# Patient Record
Sex: Female | Born: 1970 | Race: White | Hispanic: No | Marital: Married | Smoking: Never smoker
Health system: Southern US, Community
[De-identification: ages and names within clinical notes are randomized; demographics above are authoritative.]

## PROBLEM LIST (undated history)

## (undated) DIAGNOSIS — K9041 Non-celiac gluten sensitivity: Secondary | ICD-10-CM

## (undated) DIAGNOSIS — Z8489 Family history of other specified conditions: Secondary | ICD-10-CM

## (undated) DIAGNOSIS — T8859XA Other complications of anesthesia, initial encounter: Secondary | ICD-10-CM

## (undated) DIAGNOSIS — Z9889 Other specified postprocedural states: Secondary | ICD-10-CM

## (undated) DIAGNOSIS — N879 Dysplasia of cervix uteri, unspecified: Secondary | ICD-10-CM

## (undated) DIAGNOSIS — T4145XA Adverse effect of unspecified anesthetic, initial encounter: Secondary | ICD-10-CM

## (undated) DIAGNOSIS — N939 Abnormal uterine and vaginal bleeding, unspecified: Secondary | ICD-10-CM

## (undated) DIAGNOSIS — R112 Nausea with vomiting, unspecified: Secondary | ICD-10-CM

## (undated) DIAGNOSIS — R519 Headache, unspecified: Secondary | ICD-10-CM

## (undated) DIAGNOSIS — K589 Irritable bowel syndrome without diarrhea: Secondary | ICD-10-CM

## (undated) DIAGNOSIS — Z8759 Personal history of other complications of pregnancy, childbirth and the puerperium: Secondary | ICD-10-CM

## (undated) HISTORY — DX: Headache, unspecified: R51.9

## (undated) HISTORY — PX: CERVICAL BIOPSY  W/ LOOP ELECTRODE EXCISION: SUR135

---
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
## 1994-05-27 HISTORY — PX: HYSTEROSCOPY WITH D & C: SHX1775

## 1997-10-24 ENCOUNTER — Other Ambulatory Visit: Admission: RE | Admit: 1997-10-24 | Discharge: 1997-10-24 | Payer: Self-pay | Admitting: Obstetrics and Gynecology

## 2010-06-06 ENCOUNTER — Encounter
Admission: RE | Admit: 2010-06-06 | Discharge: 2010-06-06 | Payer: Self-pay | Source: Home / Self Care | Attending: Obstetrics and Gynecology | Admitting: Obstetrics and Gynecology

## 2010-06-16 ENCOUNTER — Encounter: Payer: Self-pay | Admitting: Obstetrics and Gynecology

## 2011-06-19 ENCOUNTER — Other Ambulatory Visit: Payer: Self-pay | Admitting: Obstetrics and Gynecology

## 2011-06-19 DIAGNOSIS — Z1231 Encounter for screening mammogram for malignant neoplasm of breast: Secondary | ICD-10-CM

## 2011-07-03 ENCOUNTER — Ambulatory Visit: Payer: Self-pay

## 2011-07-09 ENCOUNTER — Ambulatory Visit
Admission: RE | Admit: 2011-07-09 | Discharge: 2011-07-09 | Disposition: A | Discharge status: Home / Self Care | Payer: PRIVATE HEALTH INSURANCE | Source: Ambulatory Visit | Attending: Obstetrics and Gynecology | Admitting: Obstetrics and Gynecology

## 2011-07-09 DIAGNOSIS — Z1231 Encounter for screening mammogram for malignant neoplasm of breast: Secondary | ICD-10-CM

## 2012-06-09 ENCOUNTER — Other Ambulatory Visit: Payer: Self-pay | Admitting: Obstetrics and Gynecology

## 2012-06-09 DIAGNOSIS — Z1231 Encounter for screening mammogram for malignant neoplasm of breast: Secondary | ICD-10-CM

## 2012-06-10 ENCOUNTER — Other Ambulatory Visit: Payer: Self-pay | Admitting: Orthopedic Surgery

## 2012-06-10 DIAGNOSIS — M25511 Pain in right shoulder: Secondary | ICD-10-CM

## 2012-06-11 ENCOUNTER — Ambulatory Visit
Admission: RE | Admit: 2012-06-11 | Discharge: 2012-06-11 | Disposition: A | Discharge status: Home / Self Care | Payer: PRIVATE HEALTH INSURANCE | Source: Ambulatory Visit | Attending: Orthopedic Surgery | Admitting: Orthopedic Surgery

## 2012-06-11 DIAGNOSIS — M25511 Pain in right shoulder: Secondary | ICD-10-CM

## 2012-06-12 ENCOUNTER — Other Ambulatory Visit: Payer: PRIVATE HEALTH INSURANCE

## 2012-07-09 ENCOUNTER — Ambulatory Visit: Payer: PRIVATE HEALTH INSURANCE

## 2012-08-13 ENCOUNTER — Ambulatory Visit
Admission: RE | Admit: 2012-08-13 | Discharge: 2012-08-13 | Disposition: A | Discharge status: Home / Self Care | Payer: PRIVATE HEALTH INSURANCE | Source: Ambulatory Visit | Attending: Obstetrics and Gynecology | Admitting: Obstetrics and Gynecology

## 2013-07-14 ENCOUNTER — Other Ambulatory Visit: Payer: Self-pay

## 2013-07-14 DIAGNOSIS — Z1231 Encounter for screening mammogram for malignant neoplasm of breast: Secondary | ICD-10-CM

## 2013-08-18 ENCOUNTER — Ambulatory Visit
Admission: RE | Admit: 2013-08-18 | Discharge: 2013-08-18 | Disposition: A | Discharge status: Home / Self Care | Payer: PRIVATE HEALTH INSURANCE | Source: Ambulatory Visit

## 2013-08-18 DIAGNOSIS — Z1231 Encounter for screening mammogram for malignant neoplasm of breast: Secondary | ICD-10-CM

## 2014-09-01 ENCOUNTER — Other Ambulatory Visit: Payer: Self-pay

## 2014-09-01 DIAGNOSIS — Z1231 Encounter for screening mammogram for malignant neoplasm of breast: Secondary | ICD-10-CM

## 2014-09-09 ENCOUNTER — Ambulatory Visit
Admission: RE | Admit: 2014-09-09 | Discharge: 2014-09-09 | Disposition: A | Discharge status: Home / Self Care | Payer: 59 | Source: Ambulatory Visit

## 2014-09-09 DIAGNOSIS — Z1231 Encounter for screening mammogram for malignant neoplasm of breast: Secondary | ICD-10-CM

## 2015-06-07 NOTE — H&P (Addendum)
  Martha Hughes [id number] 05/11/2015 S:  [name] presents today for postop.  She had a LEEP procedure November 23.  She is continuing to have irregular spotting since then.  She is on the NuvaRing, and she has actually been struggling with irregular bleeding over  the last year which has not been controlled with the hormones.  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  We did a cervical LEEP hat which also showed severe dysplasia all the way to the resected margins.  Because there was a question of atypical glandular of the cells, we did do an endometrial biopsy which was negative.  I did discuss this with Dr. Andrey Farmerossi who [facility].  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  Due to her history of abnormal uterine bleeding, canal disease, patient and her husband would like to proceed with hysterectomy.  O:  On physical exam, the cervix appears to be healing nicely.  There is some bleeding from the endocervix.  The uterus is anteverted, mobile and nontender.   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  Per the recommendations of the oncologist and patient continuing to have problems with bleeding would prefer to proceed with hysterectomy.  We would like to approach this from a laparoscopically-assisted vaginal hysterectomy with bilateral salpingectomy.  I discussed the pros and cons, risks and benefits and the recovery.  She would like to proceed with this.  Candice Campavid Lavell Supple, MD  This patient has been seen and examined.   All of her questions were answered.  Labs and vital signs reviewed.  Informed consent has been obtained.  The History and Physical is current. 06/19/15 0715 DL

## 2015-06-12 ENCOUNTER — Encounter (HOSPITAL_BASED_OUTPATIENT_CLINIC_OR_DEPARTMENT_OTHER): Payer: Self-pay | Admitting: *Deleted

## 2015-06-12 NOTE — Progress Notes (Signed)
NPO AFTER MN.  ARRIVE AT 0600.  NEEDS T & S AND URINE PREG.  GETTING CBC DONE TUES. 06-13-2015.  AWARE OWER AT MAIN.

## 2015-06-13 DIAGNOSIS — N879 Dysplasia of cervix uteri, unspecified: Secondary | ICD-10-CM | POA: Diagnosis present

## 2015-06-13 DIAGNOSIS — D060 Carcinoma in situ of endocervix: Secondary | ICD-10-CM | POA: Diagnosis not present

## 2015-06-13 LAB — CBC
HEMATOCRIT: 42.3 % (ref 36.0–46.0)
Hemoglobin: 13.6 g/dL (ref 12.0–15.0)
MCH: 28.1 pg (ref 26.0–34.0)
MCHC: 32.2 g/dL (ref 30.0–36.0)
MCV: 87.4 fL (ref 78.0–100.0)
PLATELETS: 249 10*3/uL (ref 150–400)
RBC: 4.84 MIL/uL (ref 3.87–5.11)
RDW: 12.7 % (ref 11.5–15.5)
WBC: 6.1 10*3/uL (ref 4.0–10.5)

## 2015-06-14 ENCOUNTER — Encounter (HOSPITAL_BASED_OUTPATIENT_CLINIC_OR_DEPARTMENT_OTHER): Payer: Self-pay | Admitting: *Deleted

## 2015-06-18 ENCOUNTER — Encounter (HOSPITAL_BASED_OUTPATIENT_CLINIC_OR_DEPARTMENT_OTHER): Payer: Self-pay | Admitting: Anesthesiology

## 2015-06-18 NOTE — Anesthesia Preprocedure Evaluation (Addendum)
Anesthesia Evaluation  Patient identified by MRN, date of birth, ID band Patient awake    Reviewed: Allergy & Precautions, NPO status , Patient's Chart, lab work & pertinent test results  History of Anesthesia Complications (+) Family history of anesthesia reaction and history of anesthetic complications  Airway Mallampati: II  TM Distance: >3 FB Neck ROM: Full    Dental no notable dental hx.    Pulmonary neg pulmonary ROS,    Pulmonary exam normal breath sounds clear to auscultation       Cardiovascular negative cardio ROS Normal cardiovascular exam Rhythm:Regular Rate:Normal     Neuro/Psych negative neurological ROS  negative psych ROS   GI/Hepatic negative GI ROS, Neg liver ROS,   Endo/Other  negative endocrine ROS  Renal/GU negative Renal ROS  negative genitourinary   Musculoskeletal negative musculoskeletal ROS (+)   Abdominal   Peds negative pediatric ROS (+)  Hematology negative hematology ROS (+)   Anesthesia Other Findings   Reproductive/Obstetrics negative OB ROS                             Anesthesia Physical Anesthesia Plan  ASA: II  Anesthesia Plan: General   Post-op Pain Management:    Induction: Intravenous  Airway Management Planned: Oral ETT  Additional Equipment:   Intra-op Plan:   Post-operative Plan: Extubation in OR  Informed Consent: I have reviewed the patients History and Physical, chart, labs and discussed the procedure including the risks, benefits and alternatives for the proposed anesthesia with the patient or authorized representative who has indicated his/her understanding and acceptance.   Dental advisory given  Plan Discussed with: CRNA  Anesthesia Plan Comments:         Anesthesia Quick Evaluation

## 2015-06-19 ENCOUNTER — Observation Stay (HOSPITAL_BASED_OUTPATIENT_CLINIC_OR_DEPARTMENT_OTHER)
Admission: RE | Admit: 2015-06-19 | Discharge: 2015-06-20 | Disposition: A | Discharge status: Home / Self Care | Payer: 59 | Source: Ambulatory Visit | Attending: Obstetrics and Gynecology | Admitting: Obstetrics and Gynecology

## 2015-06-19 ENCOUNTER — Ambulatory Visit (HOSPITAL_BASED_OUTPATIENT_CLINIC_OR_DEPARTMENT_OTHER): Payer: 59 | Admitting: Anesthesiology

## 2015-06-19 ENCOUNTER — Encounter (HOSPITAL_COMMUNITY)
Admission: RE | Disposition: A | Discharge status: Home / Self Care | Payer: Self-pay | Source: Ambulatory Visit | Attending: Obstetrics and Gynecology

## 2015-06-19 ENCOUNTER — Encounter (HOSPITAL_BASED_OUTPATIENT_CLINIC_OR_DEPARTMENT_OTHER): Payer: Self-pay | Admitting: *Deleted

## 2015-06-19 DIAGNOSIS — D060 Carcinoma in situ of endocervix: Principal | ICD-10-CM | POA: Insufficient documentation

## 2015-06-19 DIAGNOSIS — Z90710 Acquired absence of both cervix and uterus: Secondary | ICD-10-CM | POA: Diagnosis present

## 2015-06-19 HISTORY — DX: Irritable bowel syndrome, unspecified: K58.9

## 2015-06-19 HISTORY — DX: Dysplasia of cervix uteri, unspecified: N87.9

## 2015-06-19 HISTORY — DX: Other specified postprocedural states: Z98.890

## 2015-06-19 HISTORY — DX: Other complications of anesthesia, initial encounter: T88.59XA

## 2015-06-19 HISTORY — DX: Abnormal uterine and vaginal bleeding, unspecified: N93.9

## 2015-06-19 HISTORY — DX: Nausea with vomiting, unspecified: R11.2

## 2015-06-19 HISTORY — DX: Non-celiac gluten sensitivity: K90.41

## 2015-06-19 HISTORY — DX: Adverse effect of unspecified anesthetic, initial encounter: T41.45XA

## 2015-06-19 HISTORY — DX: Family history of other specified conditions: Z84.89

## 2015-06-19 HISTORY — PX: LAPAROSCOPIC VAGINAL HYSTERECTOMY WITH SALPINGECTOMY: SHX6680

## 2015-06-19 HISTORY — DX: Personal history of other complications of pregnancy, childbirth and the puerperium: Z87.59

## 2015-06-19 LAB — TYPE AND SCREEN
ABO/RH(D): O NEG
ANTIBODY SCREEN: NEGATIVE

## 2015-06-19 SURGERY — HYSTERECTOMY, VAGINAL, LAPAROSCOPY-ASSISTED, WITH SALPINGECTOMY
Anesthesia: General | Site: Uterus

## 2015-06-19 MED ORDER — ROCURONIUM BROMIDE 100 MG/10ML IV SOLN
INTRAVENOUS | Status: DC | PRN
  Administered 2015-06-19: 25 mg via INTRAVENOUS
  Administered 2015-06-19: 5 mg via INTRAVENOUS

## 2015-06-19 MED ORDER — OXYCODONE-ACETAMINOPHEN 5-325 MG PO TABS
1.0000 | ORAL_TABLET | ORAL | Status: DC | PRN
  Administered 2015-06-20: 1 via ORAL
  Filled 2015-06-19: qty 1

## 2015-06-19 MED ORDER — ARTIFICIAL TEARS OP OINT
TOPICAL_OINTMENT | OPHTHALMIC | Status: AC
  Filled 2015-06-19: qty 3.5

## 2015-06-19 MED ORDER — EPHEDRINE SULFATE 50 MG/ML IJ SOLN
INTRAMUSCULAR | Status: AC
  Filled 2015-06-19: qty 1

## 2015-06-19 MED ORDER — MIDAZOLAM HCL 2 MG/2ML IJ SOLN
INTRAMUSCULAR | Status: AC
  Filled 2015-06-19: qty 2

## 2015-06-19 MED ORDER — ONDANSETRON HCL 4 MG/2ML IJ SOLN
4.0000 mg | Freq: Four times a day (QID) | INTRAMUSCULAR | Status: DC | PRN
  Administered 2015-06-19: 4 mg via INTRAVENOUS
  Filled 2015-06-19: qty 2

## 2015-06-19 MED ORDER — PROPOFOL 10 MG/ML IV BOLUS
INTRAVENOUS | Status: AC
  Filled 2015-06-19: qty 40

## 2015-06-19 MED ORDER — SCOPOLAMINE 1 MG/3DAYS TD PT72
1.0000 | MEDICATED_PATCH | TRANSDERMAL | Status: DC
  Administered 2015-06-19: 1.5 mg via TRANSDERMAL
  Filled 2015-06-19: qty 1

## 2015-06-19 MED ORDER — LACTATED RINGERS IR SOLN
Status: DC | PRN
  Administered 2015-06-19: 3000 mL

## 2015-06-19 MED ORDER — SCOPOLAMINE 1 MG/3DAYS TD PT72
MEDICATED_PATCH | TRANSDERMAL | Status: AC
  Filled 2015-06-19: qty 1

## 2015-06-19 MED ORDER — HYDROMORPHONE 1 MG/ML IV SOLN
INTRAVENOUS | Status: DC
  Administered 2015-06-19: 2.6 mg via INTRAVENOUS
  Administered 2015-06-19: 13:00:00 via INTRAVENOUS
  Administered 2015-06-20: 1.2 mg via INTRAVENOUS
  Administered 2015-06-20: 1.4 mg via INTRAVENOUS
  Administered 2015-06-20: 0.6 mg via INTRAVENOUS
  Filled 2015-06-19: qty 25

## 2015-06-19 MED ORDER — FENTANYL CITRATE (PF) 100 MCG/2ML IJ SOLN
INTRAMUSCULAR | Status: DC | PRN
  Administered 2015-06-19 (×7): 50 ug via INTRAVENOUS

## 2015-06-19 MED ORDER — IBUPROFEN 200 MG PO TABS
600.0000 mg | ORAL_TABLET | Freq: Four times a day (QID) | ORAL | Status: DC | PRN
  Administered 2015-06-20: 600 mg via ORAL
  Filled 2015-06-19: qty 3

## 2015-06-19 MED ORDER — DEXTROSE-NACL 5-0.45 % IV SOLN
INTRAVENOUS | Status: DC
Start: 2015-06-19 — End: 2015-06-20
  Administered 2015-06-19 – 2015-06-20 (×3): via INTRAVENOUS

## 2015-06-19 MED ORDER — DEXTROSE 5 % IV SOLN
2.0000 g | INTRAVENOUS | Status: AC
  Administered 2015-06-19: 2 g via INTRAVENOUS
  Filled 2015-06-19: qty 2

## 2015-06-19 MED ORDER — MENTHOL 3 MG MT LOZG
1.0000 | LOZENGE | OROMUCOSAL | Status: DC | PRN
  Filled 2015-06-19: qty 9

## 2015-06-19 MED ORDER — GLYCOPYRROLATE 0.2 MG/ML IJ SOLN
INTRAMUSCULAR | Status: DC | PRN
  Administered 2015-06-19: 0.6 mg via INTRAVENOUS

## 2015-06-19 MED ORDER — KETOROLAC TROMETHAMINE 30 MG/ML IJ SOLN
INTRAMUSCULAR | Status: AC
  Filled 2015-06-19: qty 1

## 2015-06-19 MED ORDER — CEFOTETAN DISODIUM-DEXTROSE 2-2.08 GM-% IV SOLR
INTRAVENOUS | Status: AC
  Filled 2015-06-19: qty 50

## 2015-06-19 MED ORDER — DIPHENHYDRAMINE HCL 12.5 MG/5ML PO ELIX: 12.5000 mg | ORAL_SOLUTION | Freq: Four times a day (QID) | ORAL | Status: DC | PRN

## 2015-06-19 MED ORDER — FENTANYL CITRATE (PF) 100 MCG/2ML IJ SOLN
INTRAMUSCULAR | Status: AC
  Filled 2015-06-19: qty 2

## 2015-06-19 MED ORDER — LACTATED RINGERS IV SOLN
INTRAVENOUS | Status: DC
  Administered 2015-06-19 (×3): via INTRAVENOUS
  Filled 2015-06-19: qty 1000

## 2015-06-19 MED ORDER — SUCCINYLCHOLINE CHLORIDE 20 MG/ML IJ SOLN
INTRAMUSCULAR | Status: DC | PRN
  Administered 2015-06-19: 100 mg via INTRAVENOUS

## 2015-06-19 MED ORDER — FENTANYL CITRATE (PF) 250 MCG/5ML IJ SOLN
INTRAMUSCULAR | Status: AC
  Filled 2015-06-19: qty 5

## 2015-06-19 MED ORDER — SODIUM CHLORIDE 0.9 % IR SOLN
Status: DC | PRN
  Administered 2015-06-19: 1000 mL

## 2015-06-19 MED ORDER — DEXAMETHASONE SODIUM PHOSPHATE 10 MG/ML IJ SOLN
INTRAMUSCULAR | Status: AC
  Filled 2015-06-19: qty 1

## 2015-06-19 MED ORDER — HYDROMORPHONE HCL 1 MG/ML IJ SOLN
INTRAMUSCULAR | Status: AC
  Filled 2015-06-19: qty 1

## 2015-06-19 MED ORDER — ACETAMINOPHEN 10 MG/ML IV SOLN
INTRAVENOUS | Status: DC | PRN
  Administered 2015-06-19: 1000 mg via INTRAVENOUS

## 2015-06-19 MED ORDER — ONDANSETRON HCL 4 MG/2ML IJ SOLN
INTRAMUSCULAR | Status: AC
  Filled 2015-06-19: qty 2

## 2015-06-19 MED ORDER — SODIUM CHLORIDE 0.9 % IJ SOLN: 9.0000 mL | INTRAMUSCULAR | Status: DC | PRN

## 2015-06-19 MED ORDER — NEOSTIGMINE METHYLSULFATE 10 MG/10ML IV SOLN
INTRAVENOUS | Status: AC
  Filled 2015-06-19: qty 1

## 2015-06-19 MED ORDER — EPHEDRINE SULFATE 50 MG/ML IJ SOLN
INTRAMUSCULAR | Status: DC | PRN
  Administered 2015-06-19: 10 mg via INTRAVENOUS
  Administered 2015-06-19 (×2): 20 mg via INTRAVENOUS

## 2015-06-19 MED ORDER — GLYCOPYRROLATE 0.2 MG/ML IJ SOLN
INTRAMUSCULAR | Status: AC
  Filled 2015-06-19: qty 3

## 2015-06-19 MED ORDER — LIDOCAINE HCL 4 % MT SOLN
OROMUCOSAL | Status: DC | PRN
  Administered 2015-06-19: 2 mL via TOPICAL

## 2015-06-19 MED ORDER — BUPIVACAINE HCL (PF) 0.25 % IJ SOLN
INTRAMUSCULAR | Status: DC | PRN
  Administered 2015-06-19: 10 mL

## 2015-06-19 MED ORDER — HYDROMORPHONE HCL 1 MG/ML IJ SOLN: 0.2000 mg | INTRAMUSCULAR | Status: DC | PRN

## 2015-06-19 MED ORDER — SODIUM CHLORIDE 0.9 % IR SOLN
Status: DC | PRN
  Administered 2015-06-19: 500 mL

## 2015-06-19 MED ORDER — NALOXONE HCL 0.4 MG/ML IJ SOLN: 0.4000 mg | INTRAMUSCULAR | Status: DC | PRN

## 2015-06-19 MED ORDER — DEXAMETHASONE SODIUM PHOSPHATE 4 MG/ML IJ SOLN
INTRAMUSCULAR | Status: DC | PRN
  Administered 2015-06-19: 10 mg via INTRAVENOUS

## 2015-06-19 MED ORDER — LIDOCAINE HCL (CARDIAC) 20 MG/ML IV SOLN
INTRAVENOUS | Status: DC | PRN
  Administered 2015-06-19: 60 mg via INTRAVENOUS

## 2015-06-19 MED ORDER — ONDANSETRON HCL 4 MG/2ML IJ SOLN
INTRAMUSCULAR | Status: DC | PRN
  Administered 2015-06-19: 4 mg via INTRAVENOUS

## 2015-06-19 MED ORDER — LIDOCAINE HCL (CARDIAC) 20 MG/ML IV SOLN
INTRAVENOUS | Status: AC
  Filled 2015-06-19: qty 10

## 2015-06-19 MED ORDER — MIDAZOLAM HCL 5 MG/5ML IJ SOLN
INTRAMUSCULAR | Status: DC | PRN
  Administered 2015-06-19: 2 mg via INTRAVENOUS

## 2015-06-19 MED ORDER — ACETAMINOPHEN 10 MG/ML IV SOLN
INTRAVENOUS | Status: AC
  Filled 2015-06-19: qty 100

## 2015-06-19 MED ORDER — PROPOFOL 10 MG/ML IV BOLUS
INTRAVENOUS | Status: DC | PRN
  Administered 2015-06-19: 20 mg via INTRAVENOUS
  Administered 2015-06-19: 200 mg via INTRAVENOUS
  Administered 2015-06-19: 30 mg via INTRAVENOUS

## 2015-06-19 MED ORDER — HYDROMORPHONE HCL 1 MG/ML IJ SOLN
0.2500 mg | INTRAMUSCULAR | Status: DC | PRN
  Administered 2015-06-19: 0.25 mg via INTRAVENOUS
  Administered 2015-06-19: 0.5 mg via INTRAVENOUS
  Administered 2015-06-19 (×2): 0.25 mg via INTRAVENOUS
  Filled 2015-06-19: qty 1

## 2015-06-19 MED ORDER — MIDAZOLAM HCL 5 MG/5ML IJ SOLN: INTRAMUSCULAR | Status: DC | PRN

## 2015-06-19 MED ORDER — PROMETHAZINE HCL 25 MG/ML IJ SOLN
6.2500 mg | INTRAMUSCULAR | Status: DC | PRN
  Filled 2015-06-19: qty 1

## 2015-06-19 MED ORDER — NEOSTIGMINE METHYLSULFATE 10 MG/10ML IV SOLN
INTRAVENOUS | Status: DC | PRN
  Administered 2015-06-19: 4 mg via INTRAVENOUS

## 2015-06-19 MED ORDER — DIPHENHYDRAMINE HCL 50 MG/ML IJ SOLN: 12.5000 mg | Freq: Four times a day (QID) | INTRAMUSCULAR | Status: DC | PRN

## 2015-06-19 SURGICAL SUPPLY — 43 items
BLADE SURG 15 STRL LF C SS BP (BLADE) ×1 IMPLANT
BLADE SURG 15 STRL SS (BLADE) ×1
CATH ROBINSON RED A/P 16FR (CATHETERS) ×2 IMPLANT
CHLORAPREP W/TINT 26ML (MISCELLANEOUS) ×2 IMPLANT
COVER BACK TABLE 60X90IN (DRAPES) ×4 IMPLANT
DRSG COVADERM PLUS 2X2 (GAUZE/BANDAGES/DRESSINGS) ×4 IMPLANT
DRSG OPSITE POSTOP 3X4 (GAUZE/BANDAGES/DRESSINGS) ×2 IMPLANT
DURAPREP 26ML APPLICATOR (WOUND CARE) IMPLANT
ELECT LIGASURE LONG (ELECTRODE) ×2 IMPLANT
ELECT REM PT RETURN 9FT ADLT (ELECTROSURGICAL)
ELECTRODE REM PT RTRN 9FT ADLT (ELECTROSURGICAL) IMPLANT
FORCEPS CUTTING 45CM 5MM (CUTTING FORCEPS) ×2 IMPLANT
GLOVE BIO SURGEON STRL SZ8 (GLOVE) ×2 IMPLANT
GLOVE BIOGEL PI IND STRL 6.5 (GLOVE) ×1 IMPLANT
GLOVE BIOGEL PI IND STRL 7.0 (GLOVE) ×3 IMPLANT
GLOVE BIOGEL PI INDICATOR 6.5 (GLOVE) ×1
GLOVE BIOGEL PI INDICATOR 7.0 (GLOVE) ×3
GLOVE SURG ORTHO 8.0 STRL STRW (GLOVE) ×8 IMPLANT
IV LACTATED RINGER IRRG 3000ML (IV SOLUTION) ×1
IV LR IRRIG 3000ML ARTHROMATIC (IV SOLUTION) ×1 IMPLANT
IV NS 1000ML (IV SOLUTION) ×1
IV NS 1000ML BAXH (IV SOLUTION) ×1 IMPLANT
LIGASURE LAP L-HOOKWIRE 5 44CM (INSTRUMENTS) IMPLANT
LIQUID BAND (GAUZE/BANDAGES/DRESSINGS) ×2 IMPLANT
NEEDLE INSUFFLATION 120MM (ENDOMECHANICALS) ×2 IMPLANT
NS IRRIG 500ML POUR BTL (IV SOLUTION) ×2 IMPLANT
PACK LAVH (CUSTOM PROCEDURE TRAY) ×2 IMPLANT
SET IRRIG TUBING LAPAROSCOPIC (IRRIGATION / IRRIGATOR) IMPLANT
SLEEVE XCEL OPT CAN 5 100 (ENDOMECHANICALS) ×2 IMPLANT
STRIP CLOSURE SKIN 1/4X3 (GAUZE/BANDAGES/DRESSINGS) IMPLANT
SUT MNCRL 0 MO-4 VIOLET 18 CR (SUTURE) ×2 IMPLANT
SUT MNCRL 0 VIOLET 6X18 (SUTURE) ×1 IMPLANT
SUT MNCRL AB 0 CT1 27 (SUTURE) IMPLANT
SUT MON AB 2-0 CT1 36 (SUTURE) IMPLANT
SUT MONOCRYL 0 6X18 (SUTURE) ×1
SUT MONOCRYL 0 MO 4 18  CR/8 (SUTURE) ×2
SUT VICRYL 0 UR6 27IN ABS (SUTURE) ×2 IMPLANT
SUT VICRYL RAPIDE 3 0 (SUTURE) ×2 IMPLANT
TOWEL OR 17X24 6PK STRL BLUE (TOWEL DISPOSABLE) ×4 IMPLANT
TRAY FOLEY CATH SILVER 14FR (SET/KITS/TRAYS/PACK) ×2 IMPLANT
TROCAR OPTI TIP 5M 100M (ENDOMECHANICALS) ×2 IMPLANT
TROCAR XCEL DIL TIP R 11M (ENDOMECHANICALS) ×2 IMPLANT
WARMER LAPAROSCOPE (MISCELLANEOUS) ×2 IMPLANT

## 2015-06-19 NOTE — Transfer of Care (Signed)
Last Vitals:  Filed Vitals:   06/19/15 0619  BP: 108/49  Pulse: 83  Temp: 37.1 C  Resp: 12    Immediate Anesthesia Transfer of Care Note  Patient: Martha Hughes  Procedure(s) Performed: Procedure(s) (LRB): LAPAROSCOPIC ASSISTED VAGINAL HYSTERECTOMY WITH SALPINGECTOMY (N/A)  Patient Location: PACU  Anesthesia Type: General  Level of Consciousness: awake, alert  and oriented  Airway & Oxygen Therapy: Patient Spontanous Breathing and Patient connected to face mask oxygen  Post-op Assessment: Report given to PACU RN and Post -op Vital signs reviewed and stable  Post vital signs: Reviewed and stable  Complications: No apparent anesthesia complications

## 2015-06-19 NOTE — Progress Notes (Signed)
Pt arrived to room 1345 via stretcher from PACU. Pt is alert and oriented. VSS. Oriented pt and family to room and unit. Call bell is within reach. Will continue to monitor.

## 2015-06-19 NOTE — Anesthesia Procedure Notes (Addendum)
Procedure Name: Intubation Date/Time: 06/19/2015 7:33 AM Performed by: Mechele Claude Pre-anesthesia Checklist: Patient identified, Emergency Drugs available, Suction available and Patient being monitored Patient Re-evaluated:Patient Re-evaluated prior to inductionOxygen Delivery Method: Circle System Utilized Preoxygenation: Pre-oxygenation with 100% oxygen Intubation Type: IV induction and Cricoid Pressure applied Ventilation: Mask ventilation without difficulty Laryngoscope Size: Mac and 3 Grade View: Grade I Tube type: Oral Tube size: 7.0 mm Number of attempts: 1 Airway Equipment and Method: Stylet and LTA kit utilized Placement Confirmation: ETT inserted through vocal cords under direct vision,  positive ETCO2 and breath sounds checked- equal and bilateral Secured at: 20 cm Tube secured with: Tape Dental Injury: Teeth and Oropharynx as per pre-operative assessment

## 2015-06-19 NOTE — Anesthesia Postprocedure Evaluation (Signed)
Anesthesia Post Note  Patient: Martha Hughes  Procedure(s) Performed: Procedure(s) (LRB): LAPAROSCOPIC ASSISTED VAGINAL HYSTERECTOMY WITH RIGHT SALPINGECTOMY (N/A)  Patient location during evaluation: PACU Anesthesia Type: General Level of consciousness: awake and alert Pain management: pain level controlled Vital Signs Assessment: post-procedure vital signs reviewed and stable Respiratory status: spontaneous breathing, nonlabored ventilation, respiratory function stable and patient connected to nasal cannula oxygen Cardiovascular status: blood pressure returned to baseline and stable Postop Assessment: no signs of nausea or vomiting Anesthetic complications: no    Last Vitals:  Filed Vitals:   06/19/15 1145 06/19/15 1200  BP: 133/78 129/73  Pulse: 92 80  Temp:    Resp: 16 12    Last Pain:  Filed Vitals:   06/19/15 1250  PainSc: 5                  Eastyn Dattilo J

## 2015-06-20 ENCOUNTER — Encounter (HOSPITAL_BASED_OUTPATIENT_CLINIC_OR_DEPARTMENT_OTHER): Payer: Self-pay | Admitting: Obstetrics and Gynecology

## 2015-06-20 DIAGNOSIS — D060 Carcinoma in situ of endocervix: Secondary | ICD-10-CM | POA: Diagnosis not present

## 2015-06-20 LAB — CBC
HCT: 34 % — ABNORMAL LOW (ref 36.0–46.0)
Hemoglobin: 11.1 g/dL — ABNORMAL LOW (ref 12.0–15.0)
MCH: 28.5 pg (ref 26.0–34.0)
MCHC: 32.6 g/dL (ref 30.0–36.0)
MCV: 87.2 fL (ref 78.0–100.0)
Platelets: 217 10*3/uL (ref 150–400)
RBC: 3.9 MIL/uL (ref 3.87–5.11)
RDW: 13 % (ref 11.5–15.5)
WBC: 9.7 10*3/uL (ref 4.0–10.5)

## 2015-06-20 LAB — ABO/RH: ABO/RH(D): O NEG

## 2015-06-20 MED ORDER — OXYCODONE-ACETAMINOPHEN 5-325 MG PO TABS: 1.0000 | ORAL_TABLET | ORAL | Status: DC | PRN

## 2015-06-20 MED ORDER — IBUPROFEN 600 MG PO TABS: 600.0000 mg | ORAL_TABLET | Freq: Four times a day (QID) | ORAL | Status: AC | PRN

## 2015-06-20 NOTE — Progress Notes (Signed)
Patient d/c home,stable. Tolerated her diet at breakfast and lunch, no c/o n/v. Pain is controlled with po pain med. Has good po intake. Voided this shift. D/c instructions given, teach back utilized. Prescriptions given.

## 2015-06-20 NOTE — Discharge Summary (Signed)
Physician Discharge Summary  Patient ID: RANESHA VAL MRN: [id number] DOB/AGE: [birth year] 45 y.o.  Admit date: 06/19/2015 Discharge date: 06/20/2015  Admission Diagnoses:  Discharge Diagnoses:  Active Problems:   S/P laparoscopic assisted vaginal hysterectomy (LAVH)   Discharged Condition: good  Hospital Course: Pt underwent LAVH Bil salpingectomy with EBL 75cc.  Her postop course was unremarkable with return of bowel and bladder function on POD #1.  Tolerating po and pain well controlled with oral meds she was d/c'd home  Consults: None  Significant Diagnostic Studies: labs: post op Hgb 11.1  Treatments: surgery: LAVH bil Salpingectomy  Discharge Exam: Blood pressure 97/52, pulse 73, temperature 98.6 F (37 C), temperature source Oral, resp. rate 17, height  (1.676 m), weight 128 lb (58.06 kg), last menstrual period 05/11/2015, SpO2 100 %. General appearance: alert, cooperative, appears stated age and no distress GI: soft, non-tender; bowel sounds normal; no masses,  no organomegaly Incision/Wound:CD&I  Disposition: Final discharge disposition not confirmed  Discharge Instructions    Call MD for:  difficulty breathing, headache or visual disturbances    Complete by:  As directed      Call MD for:  persistant nausea and vomiting    Complete by:  As directed      Call MD for:  redness, tenderness, or signs of infection (pain, swelling, redness, odor or green/yellow discharge around incision site)    Complete by:  As directed      Call MD for:  severe uncontrolled pain    Complete by:  As directed      Call MD for:  temperature >100.4    Complete by:  As directed      Diet general    Complete by:  As directed      Discharge instructions    Complete by:  As directed   Follow up in the office as scheduled     Driving Restrictions    Complete by:  As directed   No driving for 2 weeks     Increase activity slowly    Complete by:  As directed      Lifting  restrictions    Complete by:  As directed   No lifting anything greater than 10 pounds (if you have to ask, don't lift it)     Sexual Activity Restrictions    Complete by:  As directed   Nothing in the vagina for 6 weeks            Medication List    TAKE these medications        acetaminophen 500 MG tablet  Commonly known as:  TYLENOL  Take 1,000 mg by mouth every 6 (six) hours as needed for headache.     ALPRAZolam 0.25 MG tablet  Commonly known as:  XANAX  Take 0.25 mg by mouth at bedtime as needed for anxiety or sleep.     ibuprofen 600 MG tablet  Commonly known as:  ADVIL,MOTRIN  Take 1 tablet (600 mg total) by mouth every 6 (six) hours as needed (mild pain).     oxyCODONE-acetaminophen 5-325 MG tablet  Commonly known as:  PERCOCET/ROXICET  Take 1-2 tablets by mouth every 4 (four) hours as needed for severe pain (moderate to severe pain (when tolerating fluids)).     RESTORA Caps  Take 1 capsule by mouth daily.     TRINTELLIX 10 MG Tabs  Generic drug:  Vortioxetine HBr  Take 1 tablet by mouth every evening.  Signed: Gregg Winchell C 06/20/2015, 8:46 AM

## 2015-06-20 NOTE — Op Note (Signed)
Martha Hughes, Martha Hughes                 ACCOUNT NO.:  [id number]  MEDICAL RECORD NO.:  [id number]  LOCATION:  1345                         FACILITY:  [facility]  PHYSICIAN:  Dineen Kid. Rana Snare, M.D.    DATE OF BIRTH:  1970-09-15  DATE OF PROCEDURE:  06/19/2015 DATE OF DISCHARGE:                              OPERATIVE REPORT   PREOPERATIVE DIAGNOSES:  Abnormal uterine bleeding and high-grade endocervical dysplasia.  POSTOPERATIVE DIAGNOSES:  Abnormal uterine bleeding and high-grade endocervical dysplasia.  PROCEDURE:  Laparoscopic-assisted vaginal hysterectomy and bilateral salpingectomy.  SURGEON:  Dineen Kid. Rana Snare, M.D.  ASSISTANT:  Juluis Mire, M.D.  ANESTHESIA:  General endotracheal.  INDICATIONS:  Martha Hughes is a 45 year old with ongoing abnormal uterine bleeding, not controlled with birth control pills, also has a history of high-grade dysplasia status post LEEP procedure, which shows high-grade canal disease, extended up into the endocervix.  After discussion with oncologist, they recommended proceeding with a cold knife conization or could proceed with hysterectomy.  The patient desires to proceed with hysterectomy.  Risks and benefits of the procedure were discussed at length and informed consent was obtained.  FINDINGS:  At the time of surgery, normal-appearing appendix, liver, uterus, tubes, and ovaries.  DESCRIPTION OF PROCEDURE:  After adequate analgesia, the patient was placed in dorsal lithotomy position.  She was sterilely prepped and draped.  Bladder was sterilely drained.  Graves speculum was placed. Tenaculum was placed on the anterior lip of the cervix.  A Cohen tenaculum was then placed.  A 1 cm infraumbilical skin incision was made.  Veress needle was inserted.  The abdomen was insufflated with dullness to percussion.  An 11 mm trocar was inserted.  The above findings were noted by laparoscope.  A 5 mm trocar was inserted to left of the midline 2 fingerbreadths  above pubic symphysis.  After careful and systematic evaluation of the abdomen and pelvis, the LigaSure instrument was used to ligate across the mesosalpinx bilaterally down across the broad ligament, across the utero-ovarian ligaments to the inferior portion of the round ligament.  This was done bilaterally and the bladder was then elevated and a small bladder flap was created. Abdomen was desufflated.  Legs were repositioned.  Posterior colpotomy was performed.  The cervix was then circumscribed with Bovie cautery. LigaSure instrument was used to ligate across the uterosacral ligaments bilaterally, bladder pillars, and the uterine vasculature bilaterally. The anterior vaginal mucosa was dissected off the cervix and the anterior peritoneum was entered sharply with the Deaver retractor placed below the bladder.  Progressive placement of the LigaSure across the inferior portion of the broad ligaments until the uterus was removed. The specimen included the right fallopian tube, the left fallopian tube was not identified on the uterus, but had been removed laparoscopically. The uterosacral ligaments were identified, suture ligated with 0 Monocryl suture.  Posterior peritoneum was closed in a pursestring fashion.  The uterosacral ligaments were then plicated midline using figure-of-eights of 0 Monocryl suture.  The vagina was then closed in a vertical fashion using figure-of-eights of 0 Monocryl suture.  Good approximation and good hemostasis noted.  Foley was placed back into the bladder with return of clear  yellow urine.  Legs were repositioned.  The abdomen was reinsufflated.  The laparoscope was then reinserted and Nezhat suction irrigator was inserted, and after copious amount of irrigation, adequate hemostasis was assured.  Small peritoneal edge bleeders were ligated with bipolar cautery.  I spent approximately 15-20 minutes looking in both the left and right colonic gutters as well  as the posterior rectal space and the cul-de-sac.  Repositioned the patient in multiple positions and after extensive search for the left fallopian tube, it was felt that it must have migrated anteriorly up underneath the bowel and we were unable to visualize it.  Because the patient was hemostatic and we were unable to adequately find __________ was left. The abdomen was then desufflated.  Trocars were removed.  The infraumbilical skin incision was closed with 0 Vicryl interrupted suture to the fascia, 3-0 Vicryl Rapide subcuticular suture.  The 5 mm site was closed with 3-0 Vicryl Rapide subcuticular suture.  The incision was injected with 0.25% Marcaine, total of 10 mL used.  The patient was stable and transferred to recovery room.  Sponges and instrument counts were normal x3.  Estimated blood loss 75 mL.  The patient received 2 g of cefotetan preoperatively.     Dineen Kid Rana Snare, M.D.     DCL/MEDQ  D:  06/19/2015  T:  06/19/2015  Job:  [id number]

## 2015-06-20 NOTE — Progress Notes (Signed)
1 Day Post-Op Procedure(s) (LRB): LAPAROSCOPIC ASSISTED VAGINAL HYSTERECTOMY WITH RIGHT SALPINGECTOMY (N/A)  Subjective: Patient reports tolerating PO.  Pain well controlled  Objective: I have reviewed patient's vital signs, intake and output, medications and labs.  General: alert, cooperative, appears stated age and no distress GI: soft, non-tender; bowel sounds normal; no masses,  no organomegaly and incision: clean, dry and intact Vaginal Bleeding: minimal  Assessment: s/p Procedure(s): LAPAROSCOPIC ASSISTED VAGINAL HYSTERECTOMY WITH RIGHT SALPINGECTOMY (N/A): stable and progressing well  Plan: Advance diet Encourage ambulation Advance to PO medication Discontinue IV fluids  If does well, will dc home     Brevin Mcfadden C 06/20/2015, 8:40 AM

## 2015-08-14 ENCOUNTER — Other Ambulatory Visit: Payer: Self-pay

## 2015-08-14 DIAGNOSIS — N6325 Unspecified lump in the left breast, overlapping quadrants: Secondary | ICD-10-CM

## 2015-08-14 DIAGNOSIS — N6320 Unspecified lump in the left breast, unspecified quadrant: Principal | ICD-10-CM

## 2015-08-28 ENCOUNTER — Other Ambulatory Visit: Payer: Self-pay

## 2015-08-28 DIAGNOSIS — N6325 Unspecified lump in the left breast, overlapping quadrants: Secondary | ICD-10-CM

## 2015-08-28 DIAGNOSIS — N6320 Unspecified lump in the left breast, unspecified quadrant: Principal | ICD-10-CM

## 2015-08-28 DIAGNOSIS — Z1231 Encounter for screening mammogram for malignant neoplasm of breast: Secondary | ICD-10-CM

## 2015-09-12 ENCOUNTER — Ambulatory Visit
Admission: RE | Admit: 2015-09-12 | Discharge: 2015-09-12 | Disposition: A | Discharge status: Home / Self Care | Payer: No Typology Code available for payment source | Source: Ambulatory Visit

## 2015-09-12 DIAGNOSIS — N6325 Unspecified lump in the left breast, overlapping quadrants: Secondary | ICD-10-CM

## 2015-09-12 DIAGNOSIS — N6320 Unspecified lump in the left breast, unspecified quadrant: Principal | ICD-10-CM

## 2015-09-12 DIAGNOSIS — Z1231 Encounter for screening mammogram for malignant neoplasm of breast: Secondary | ICD-10-CM

## 2015-09-13 ENCOUNTER — Other Ambulatory Visit: Payer: Self-pay | Admitting: Obstetrics and Gynecology

## 2015-09-13 DIAGNOSIS — R928 Other abnormal and inconclusive findings on diagnostic imaging of breast: Secondary | ICD-10-CM

## 2015-09-19 ENCOUNTER — Ambulatory Visit
Admission: RE | Admit: 2015-09-19 | Discharge: 2015-09-19 | Disposition: A | Discharge status: Home / Self Care | Payer: No Typology Code available for payment source | Source: Ambulatory Visit | Attending: Obstetrics and Gynecology | Admitting: Obstetrics and Gynecology

## 2015-09-19 DIAGNOSIS — R928 Other abnormal and inconclusive findings on diagnostic imaging of breast: Secondary | ICD-10-CM

## 2016-08-26 ENCOUNTER — Ambulatory Visit (INDEPENDENT_AMBULATORY_CARE_PROVIDER_SITE_OTHER): Payer: BC Managed Care – PPO | Admitting: Psychology

## 2016-08-26 DIAGNOSIS — F411 Generalized anxiety disorder: Secondary | ICD-10-CM

## 2016-09-12 ENCOUNTER — Ambulatory Visit (INDEPENDENT_AMBULATORY_CARE_PROVIDER_SITE_OTHER): Payer: BC Managed Care – PPO | Admitting: Psychology

## 2016-09-12 DIAGNOSIS — F411 Generalized anxiety disorder: Secondary | ICD-10-CM

## 2016-09-30 ENCOUNTER — Ambulatory Visit (INDEPENDENT_AMBULATORY_CARE_PROVIDER_SITE_OTHER): Payer: BC Managed Care – PPO | Admitting: Psychology

## 2016-09-30 DIAGNOSIS — F411 Generalized anxiety disorder: Secondary | ICD-10-CM | POA: Diagnosis not present

## 2016-10-23 ENCOUNTER — Ambulatory Visit (INDEPENDENT_AMBULATORY_CARE_PROVIDER_SITE_OTHER): Payer: BC Managed Care – PPO | Admitting: Psychology

## 2016-10-23 DIAGNOSIS — F411 Generalized anxiety disorder: Secondary | ICD-10-CM | POA: Diagnosis not present

## 2016-11-20 ENCOUNTER — Ambulatory Visit: Payer: BC Managed Care – PPO | Admitting: Psychology

## 2016-12-10 ENCOUNTER — Other Ambulatory Visit: Payer: Self-pay | Admitting: Obstetrics and Gynecology

## 2016-12-10 DIAGNOSIS — Z1231 Encounter for screening mammogram for malignant neoplasm of breast: Secondary | ICD-10-CM

## 2016-12-19 ENCOUNTER — Ambulatory Visit: Payer: BC Managed Care – PPO | Admitting: Psychology

## 2016-12-23 ENCOUNTER — Ambulatory Visit
Admission: RE | Admit: 2016-12-23 | Discharge: 2016-12-23 | Disposition: A | Discharge status: Home / Self Care | Payer: BC Managed Care – PPO | Source: Ambulatory Visit | Attending: Obstetrics and Gynecology | Admitting: Obstetrics and Gynecology

## 2016-12-23 DIAGNOSIS — Z1231 Encounter for screening mammogram for malignant neoplasm of breast: Secondary | ICD-10-CM

## 2017-01-08 ENCOUNTER — Ambulatory Visit: Payer: BC Managed Care – PPO | Admitting: Psychology

## 2017-01-31 ENCOUNTER — Ambulatory Visit (INDEPENDENT_AMBULATORY_CARE_PROVIDER_SITE_OTHER): Payer: BC Managed Care – PPO | Admitting: Psychology

## 2017-01-31 DIAGNOSIS — F411 Generalized anxiety disorder: Secondary | ICD-10-CM | POA: Diagnosis not present

## 2017-03-12 ENCOUNTER — Ambulatory Visit (INDEPENDENT_AMBULATORY_CARE_PROVIDER_SITE_OTHER): Payer: BC Managed Care – PPO | Admitting: Psychology

## 2017-03-12 DIAGNOSIS — F411 Generalized anxiety disorder: Secondary | ICD-10-CM | POA: Diagnosis not present

## 2017-04-22 ENCOUNTER — Ambulatory Visit: Payer: BC Managed Care – PPO | Admitting: Psychology

## 2018-03-23 ENCOUNTER — Other Ambulatory Visit: Payer: Self-pay | Admitting: Obstetrics and Gynecology

## 2018-03-23 DIAGNOSIS — Z1231 Encounter for screening mammogram for malignant neoplasm of breast: Secondary | ICD-10-CM

## 2018-05-04 ENCOUNTER — Ambulatory Visit: Payer: Self-pay

## 2018-06-12 ENCOUNTER — Ambulatory Visit
Admission: RE | Admit: 2018-06-12 | Discharge: 2018-06-12 | Disposition: A | Discharge status: Home / Self Care | Payer: BC Managed Care – PPO | Source: Ambulatory Visit | Attending: Obstetrics and Gynecology | Admitting: Obstetrics and Gynecology

## 2018-06-12 DIAGNOSIS — Z1231 Encounter for screening mammogram for malignant neoplasm of breast: Secondary | ICD-10-CM

## 2019-02-09 ENCOUNTER — Other Ambulatory Visit (HOSPITAL_COMMUNITY): Payer: Self-pay | Admitting: Internal Medicine

## 2019-02-09 ENCOUNTER — Ambulatory Visit (HOSPITAL_COMMUNITY)
Admission: RE | Admit: 2019-02-09 | Discharge: 2019-02-09 | Disposition: A | Discharge status: Home / Self Care | Payer: BC Managed Care – PPO | Source: Ambulatory Visit | Attending: Family | Admitting: Family

## 2019-02-09 ENCOUNTER — Other Ambulatory Visit: Payer: Self-pay

## 2019-02-09 DIAGNOSIS — R609 Edema, unspecified: Secondary | ICD-10-CM | POA: Insufficient documentation

## 2019-07-24 ENCOUNTER — Ambulatory Visit: Payer: Self-pay | Attending: Internal Medicine

## 2019-07-24 DIAGNOSIS — Z23 Encounter for immunization: Secondary | ICD-10-CM | POA: Insufficient documentation

## 2019-07-24 NOTE — Progress Notes (Signed)
   Covid-19 Vaccination Clinic  Name:  Martha Hughes    MRN: [id number] DOB: [birth year]  07/24/2019  Martha Hughes was observed post Covid-19 immunization for 15 minutes without incidence. She was provided with Vaccine Information Sheet and instruction to access the V-Safe system.   Martha Hughes was instructed to call 911 with any severe reactions post vaccine: Marland Kitchen Difficulty breathing  . Swelling of your face and throat  . A fast heartbeat  . A bad rash all over your body  . Dizziness and weakness    Immunizations Administered    Name Date Dose VIS Date Route   Pfizer COVID-19 Vaccine 07/24/2019  4:51 PM 0.3 mL 05/07/2019 Intramuscular   Manufacturer: [employer], [employer]   Lot: [id number]   NDC: [address]-7

## 2019-08-14 ENCOUNTER — Ambulatory Visit: Payer: Self-pay | Attending: Internal Medicine

## 2019-08-14 DIAGNOSIS — Z23 Encounter for immunization: Secondary | ICD-10-CM

## 2019-08-14 NOTE — Progress Notes (Signed)
   Covid-19 Vaccination Clinic  Name:  Martha Hughes    MRN: [id number] DOB: 1970-09-06  08/14/2019  [name] was observed post Covid-19 immunization for 15 minutes without incident. She was provided with Vaccine Information Sheet and instruction to access the V-Safe system.   [name] was instructed to call 911 with any severe reactions post vaccine: Marland Kitchen Difficulty breathing  . Swelling of face and throat  . A fast heartbeat  . A bad rash all over body  . Dizziness and weakness   Immunizations Administered    Name Date Dose VIS Date Route   Pfizer COVID-19 Vaccine 08/14/2019  8:38 AM 0.3 mL 05/07/2019 Intramuscular   Manufacturer: [employer], [employer]   Lot: [id number]   NDC: [address]-0

## 2019-08-17 ENCOUNTER — Other Ambulatory Visit: Payer: Self-pay | Admitting: Obstetrics and Gynecology

## 2019-08-17 DIAGNOSIS — Z1231 Encounter for screening mammogram for malignant neoplasm of breast: Secondary | ICD-10-CM

## 2019-09-27 ENCOUNTER — Other Ambulatory Visit: Payer: Self-pay

## 2019-09-27 ENCOUNTER — Ambulatory Visit
Admission: RE | Admit: 2019-09-27 | Discharge: 2019-09-27 | Disposition: A | Discharge status: Home / Self Care | Payer: Self-pay | Source: Ambulatory Visit | Attending: Obstetrics and Gynecology | Admitting: Obstetrics and Gynecology

## 2019-09-27 DIAGNOSIS — Z1231 Encounter for screening mammogram for malignant neoplasm of breast: Secondary | ICD-10-CM

## 2019-12-27 ENCOUNTER — Encounter: Payer: Self-pay | Admitting: *Deleted

## 2019-12-28 ENCOUNTER — Encounter: Payer: Self-pay | Admitting: Diagnostic Neuroimaging

## 2019-12-28 ENCOUNTER — Ambulatory Visit: Payer: 59 | Admitting: Diagnostic Neuroimaging

## 2019-12-28 VITALS — BP 111/68 | HR 74 | Ht 67.0 in | Wt 142.2 lb

## 2019-12-28 DIAGNOSIS — R519 Headache, unspecified: Secondary | ICD-10-CM | POA: Diagnosis not present

## 2019-12-28 DIAGNOSIS — G43009 Migraine without aura, not intractable, without status migrainosus: Secondary | ICD-10-CM

## 2019-12-28 MED ORDER — TOPIRAMATE 50 MG PO TABS: 50.0000 mg | ORAL_TABLET | Freq: Two times a day (BID) | ORAL | 12 refills | Status: AC

## 2019-12-28 MED ORDER — RIZATRIPTAN BENZOATE 10 MG PO TBDP: 10.0000 mg | ORAL_TABLET | ORAL | 11 refills | Status: AC | PRN

## 2019-12-28 NOTE — Patient Instructions (Addendum)
-   check MRI brain   MIGRAINE PREVENTION  LIFESTYLE CHANGES -Stop or avoid smoking -Decrease or avoid caffeine / alcohol -Eat and sleep on a regular schedule -Exercise several times per week - start topiramate 50mg at bedtime; after 1-2 weeks increase to 50mg twice a day; drink plenty of water   MIGRAINE RESCUE  - ibuprofen, tylenol as needed - start rizatriptan (Maxalt) 10mg as needed for breakthrough headache; may repeat x 1 after 2 hours; max 2 tabs per day or 8 per month    

## 2019-12-28 NOTE — Progress Notes (Signed)
[facility]  PATIENT: Martha Hughes DOB: July 28, 1970  REFERRING CLINICIAN: Creola Corn, MD HISTORY FROM: PATIENT  REASON FOR VISIT: NEW CONSULT    HISTORICAL  CHIEF COMPLAINT:  Chief Complaint  Patient presents with  . Headache    rm 6 New Pt "new issue x 2 weeks, headache woke her up x 3 nights-hurt worse when laying on right side, headache started in back of head and came around right side to front of head; Tylenol typically helps with headaches"    HISTORY OF PRESENT ILLNESS:   49 year old female here for evaluation of headaches.  12/12/2019 patient had onset of throbbing headache in the back of her head radiating to her right temporal region.  Patient had some nausea.  No sensitive to light or sound.  No visual aura.  Patient has had daily headaches since that time.  She has had some further flareups over the last few days with throbbing sensation in the middle of the night.  Patient has had some migraine as a child.  She has family history migraine in her maternal aunt. She has tried Tylenol and Excedrin with mild relief.  No recent triggering or aggravating factors.  Patient went to PCP for evaluation and was prescribed prednisone and Robaxin with mild relief.   REVIEW OF SYSTEMS: Full 14 system review of systems performed and negative with exception of: As per HPI.  ALLERGIES: Allergies  Allergen Reactions  . Gluten Meal Diarrhea    Stomach pains, emotional lability.     HOME MEDICATIONS: Outpatient Medications Prior to Visit  Medication Sig Dispense Refill  . acetaminophen (TYLENOL) 500 MG tablet Take 1,000 mg by mouth every 6 (six) hours as needed for headache.    . ALPRAZolam (XANAX) 0.25 MG tablet Take 0.25 mg by mouth at bedtime as needed for anxiety or sleep.     Marland Kitchen ibuprofen (ADVIL,MOTRIN) 600 MG tablet Take 1 tablet (600 mg total) by mouth every 6 (six) hours as needed (mild pain). 30 tablet 0  . Probiotic Product (RESTORA) CAPS Take 1  capsule by mouth daily.    . Vortioxetine HBr (TRINTELLIX) 10 MG TABS Take 1 tablet by mouth every evening.    . methocarbamol (ROBAXIN) 500 MG tablet 12/28/19 Not recently (Patient not taking: Reported on 12/28/2019)    . oxyCODONE-acetaminophen (PERCOCET/ROXICET) 5-325 MG tablet Take 1-2 tablets by mouth every 4 (four) hours as needed for severe pain (moderate to severe pain (when tolerating fluids)). 30 tablet 0   No facility-administered medications prior to visit.    PAST MEDICAL HISTORY: Past Medical History:  Diagnosis Date  . Abnormal uterine bleeding (AUB)   . Complication of anesthesia   . Dysplasia of cervix    endocervical  . Family history of adverse reaction to anesthesia    father-- post op ilieus  . Gluten-sensitive enteropathy   . Headache   . History of gestational hypertension    postclampsia  2009  . IBS (irritable bowel syndrome)   . PONV (postoperative nausea and vomiting)     PAST SURGICAL HISTORY: Past Surgical History:  Procedure Laterality Date  . CERVICAL BIOPSY  W/ LOOP ELECTRODE EXCISION  Nov 2016  (office)  . CESAREAN SECTION  2009  . HYSTEROSCOPY WITH D & C  1996  . LAPAROSCOPIC VAGINAL HYSTERECTOMY WITH SALPINGECTOMY N/A 06/19/2015   Procedure: LAPAROSCOPIC ASSISTED VAGINAL HYSTERECTOMY WITH RIGHT SALPINGECTOMY;  Surgeon: Candice Camp, MD;  Location: [facility];  Service: Gynecology;  Laterality: N/A;  .  MANDIBLE SURGERY  1991   correct crossbite (retained hardware bilateral)    FAMILY HISTORY: Family History  Problem Relation Age of Onset  . High Cholesterol Mother   . Prostate cancer Father   . Hypertension Father   . High Cholesterol Father   . Hypertension Brother   . High Cholesterol Brother   . Migraines Maternal Aunt     SOCIAL HISTORY: Social History   Socioeconomic History  . Marital status: Married    Spouse name: Chrissie Noa  . Number of children: 1  . Years of education: college  . 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 Smoking status: Never Smoker  . Smokeless tobacco: Never Used  Substance and Sexual Activity  . Alcohol use: No  . Drug use: No  . Sexual activity: Not on file  Other Topics Concern  . Not on file  Social History Narrative   Lives with husband   Caffeine- 8 oz sweet tea   Social Determinants of Health   Financial Resource Strain:   . Difficulty of Paying Living Expenses:   Food Insecurity:   . Worried About Programme researcher, broadcasting/film/video in the Last Year:   . Barista in the Last Year:   Transportation Needs:   . Freight forwarder (Medical):   Marland Kitchen Lack of Transportation (Non-Medical):   Physical Activity:   . Days of Exercise per Week:   . Minutes of Exercise per Session:   Stress:   . Feeling of Stress :   Social Connections:   . Frequency of Communication with Friends and Family:   . Frequency of Social Gatherings with Friends and Family:   . Attends Religious Services:   . Active Member of Clubs or Organizations:   . Attends Banker Meetings:   Marland Kitchen Marital Status:   Intimate Partner Violence:   . Fear of Current or Ex-Partner:   . Emotionally Abused:   Marland Kitchen Physically Abused:   . Sexually Abused:      PHYSICAL EXAM  GENERAL EXAM/CONSTITUTIONAL: Vitals:  Vitals:   12/28/19 1338  BP: 111/68  Pulse: 74  Weight: 142 lb 3.2 oz (64.5 kg)  Height: 5\' 7"  (1.702 m)     Body mass index is 22.27 kg/m. Wt Readings from Last 3 Encounters:  12/28/19 142 lb 3.2 oz (64.5 kg)  06/19/15 128 lb (58.1 kg)     Patient is in no distress; well developed, nourished and groomed; neck is supple  CARDIOVASCULAR:  Examination of carotid arteries is normal; no carotid bruits  Regular rate and rhythm, no murmurs  Examination of peripheral vascular system by observation and palpation is normal  EYES:  Ophthalmoscopic exam of optic discs and posterior segments is normal; no  papilledema or hemorrhages  No exam data present  MUSCULOSKELETAL:  Gait, strength, tone, movements noted in Neurologic exam below  NEUROLOGIC: MENTAL STATUS:  No flowsheet data found.  awake, alert, oriented to person, place and time  recent and remote memory intact  normal attention and concentration  language fluent, comprehension intact, naming intact  fund of knowledge appropriate  CRANIAL NERVE:   2nd - no papilledema on fundoscopic exam  2nd, 3rd, 4th, 6th - pupils equal and reactive to light, visual fields full to confrontation, extraocular muscles intact, no nystagmus  5th - facial sensation symmetric  7th - facial strength symmetric  8th - hearing intact  9th -  palate elevates symmetrically, uvula midline  11th - shoulder shrug symmetric  12th - tongue protrusion midline  MOTOR:   normal bulk and tone, full strength in the BUE, BLE  SENSORY:   normal and symmetric to light touch, temperature, vibration  COORDINATION:   finger-nose-finger, fine finger movements normal  REFLEXES:   deep tendon reflexes present and symmetric  GAIT/STATION:   narrow based gait     DIAGNOSTIC DATA (LABS, IMAGING, TESTING) - I reviewed patient records, labs, notes, testing and imaging myself where available.  Lab Results  Component Value Date   WBC 9.7 06/20/2015   HGB 11.1 (L) 06/20/2015   HCT 34.0 (L) 06/20/2015   MCV 87.2 06/20/2015   PLT 217 06/20/2015   No results found for: NA, K, CL, CO2, GLUCOSE, BUN, CREATININE, CALCIUM, PROT, ALBUMIN, AST, ALT, ALKPHOS, BILITOT, GFRNONAA, GFRAA No results found for: CHOL, HDL, LDLCALC, LDLDIRECT, TRIG, CHOLHDL No results found for: DQQI2L No results found for: VITAMINB12 No results found for: TSH     ASSESSMENT AND PLAN  49 y.o. year old female here with:  Dx:  1. New onset headache   2. Migraine without aura and without status migrainosus, not intractable     PLAN:  NEW ONSET HEADACHES  (12/12/19; possible migraine) - check MRI brain and labs (rule out secondary causes)   MIGRAINE TREATMENT PLAN:  MIGRAINE PREVENTION  LIFESTYLE CHANGES -Stop or avoid smoking -Decrease or avoid caffeine / alcohol -Eat and sleep on a regular schedule -Exercise several times per week - start topiramate 50mg  at bedtime; after 1-2 weeks increase to 50mg  twice a day; drink plenty of water  Consider 2nd line - erenumab (Aimovig) 70mg  monthly (may increase to 140mg  monthly) - fremanezumab (Ajovy) 225mg  monthly (or 675mg  every 3 months) - galazanezumab (Emgality) 240mg  loading dose; then 120mg  monthly  MIGRAINE RESCUE  - ibuprofen, tylenol as needed  - start rizatriptan (Maxalt) 10mg  as needed for breakthrough headache; may repeat x 1 after 2 hours; max 2 tabs per day or 8 per month - consider rimegepant (Nurtec) 75mg  as needed for breakthrough headache; max 8 per month  Orders Placed This Encounter  Procedures  . MR BRAIN W WO CONTRAST  . Sedimentation Rate  . C-reactive Protein  . CBC with Differential/Platelet  . Comprehensive metabolic panel   Meds ordered this encounter  Medications  . topiramate (TOPAMAX) 50 MG tablet    Sig: Take 1 tablet (50 mg total) by mouth 2 (two) times daily.    Dispense:  60 tablet    Refill:  12  . rizatriptan (MAXALT-MLT) 10 MG disintegrating tablet    Sig: Take 1 tablet (10 mg total) by mouth as needed for migraine. May repeat in 2 hours if needed    Dispense:  9 tablet    Refill:  11   Return in about 4 months (around 04/28/2020) for with NP (Amy Lomax).    , MD 12/28/2019, 2:24 PM Certified in Neurology, Neurophysiology and Neuroimaging  [facility] [address], [address] [place],  [phone]

## 2019-12-29 ENCOUNTER — Encounter: Payer: Self-pay | Admitting: *Deleted

## 2019-12-29 LAB — COMPREHENSIVE METABOLIC PANEL
ALT: 17 IU/L (ref 0–32)
AST: 21 IU/L (ref 0–40)
Albumin/Globulin Ratio: 2 (ref 1.2–2.2)
Albumin: 4.5 g/dL (ref 3.8–4.8)
Alkaline Phosphatase: 59 IU/L (ref 48–121)
BUN/Creatinine Ratio: 20 (ref 9–23)
BUN: 16 mg/dL (ref 6–24)
Bilirubin Total: 0.2 mg/dL (ref 0.0–1.2)
CO2: 25 mmol/L (ref 20–29)
Calcium: 9.1 mg/dL (ref 8.7–10.2)
Chloride: 101 mmol/L (ref 96–106)
Creatinine, Ser: 0.79 mg/dL (ref 0.57–1.00)
GFR calc Af Amer: 102 mL/min/{1.73_m2} (ref >59)
GFR calc non Af Amer: 89 mL/min/{1.73_m2} (ref >59)
Globulin, Total: 2.3 g/dL (ref 1.5–4.5)
Glucose: 90 mg/dL (ref 65–99)
Potassium: 4.2 mmol/L (ref 3.5–5.2)
Sodium: 140 mmol/L (ref 134–144)
Total Protein: 6.8 g/dL (ref 6.0–8.5)

## 2019-12-29 LAB — CBC WITH DIFFERENTIAL/PLATELET
Basophils Absolute: 0.1 10*3/uL (ref 0.0–0.2)
Basos: 1 %
EOS (ABSOLUTE): 0.1 10*3/uL (ref 0.0–0.4)
Eos: 1 %
Hematocrit: 36.6 % (ref 34.0–46.6)
Hemoglobin: 12.4 g/dL (ref 11.1–15.9)
Immature Grans (Abs): 0 10*3/uL (ref 0.0–0.1)
Immature Granulocytes: 0 %
Lymphocytes Absolute: 1.8 10*3/uL (ref 0.7–3.1)
Lymphs: 24 %
MCH: 29.2 pg (ref 26.6–33.0)
MCHC: 33.9 g/dL (ref 31.5–35.7)
MCV: 86 fL (ref 79–97)
Monocytes Absolute: 0.7 10*3/uL (ref 0.1–0.9)
Monocytes: 9 %
Neutrophils Absolute: 4.8 10*3/uL (ref 1.4–7.0)
Neutrophils: 65 %
Platelets: 307 10*3/uL (ref 150–450)
RBC: 4.25 x10E6/uL (ref 3.77–5.28)
RDW: 12.2 % (ref 11.7–15.4)
WBC: 7.5 10*3/uL (ref 3.4–10.8)

## 2019-12-29 LAB — C-REACTIVE PROTEIN: CRP: <1 mg/L (ref 0–10)

## 2019-12-29 LAB — SEDIMENTATION RATE: Sed Rate: 2 mm/hr (ref 0–32)

## 2020-01-04 ENCOUNTER — Telehealth: Payer: Self-pay | Admitting: Diagnostic Neuroimaging

## 2020-01-04 NOTE — Telephone Encounter (Signed)
Ethlyn Gallery: [id number], exp 01/04/20-02/03/20. Order sent to GI to call and schedule patient. NG they will reach out to the patient to schedule.

## 2020-01-11 ENCOUNTER — Ambulatory Visit
Admission: RE | Admit: 2020-01-11 | Discharge: 2020-01-11 | Disposition: A | Discharge status: Home / Self Care | Payer: 59 | Source: Ambulatory Visit | Attending: Diagnostic Neuroimaging | Admitting: Diagnostic Neuroimaging

## 2020-01-11 ENCOUNTER — Other Ambulatory Visit: Payer: Self-pay

## 2020-01-11 DIAGNOSIS — R519 Headache, unspecified: Secondary | ICD-10-CM

## 2020-01-11 MED ORDER — GADOBENATE DIMEGLUMINE 529 MG/ML IV SOLN
13.0000 mL | Freq: Once | INTRAVENOUS | Status: AC | PRN
  Administered 2020-01-11: 13 mL via INTRAVENOUS

## 2020-01-13 ENCOUNTER — Telehealth: Payer: Self-pay | Admitting: *Deleted

## 2020-01-13 NOTE — Telephone Encounter (Signed)
LVM requesting call back for results. 

## 2020-01-17 ENCOUNTER — Encounter: Payer: Self-pay | Admitting: *Deleted

## 2020-05-01 ENCOUNTER — Ambulatory Visit: Payer: 59 | Admitting: Family Medicine

## 2020-08-23 ENCOUNTER — Ambulatory Visit
Admission: RE | Admit: 2020-08-23 | Discharge: 2020-08-23 | Disposition: A | Discharge status: Home / Self Care | Payer: BC Managed Care – PPO | Source: Ambulatory Visit | Attending: Physician Assistant | Admitting: Physician Assistant

## 2020-08-23 ENCOUNTER — Other Ambulatory Visit: Payer: Self-pay | Admitting: Physician Assistant

## 2020-08-23 DIAGNOSIS — R1030 Lower abdominal pain, unspecified: Secondary | ICD-10-CM

## 2020-08-23 MED ORDER — IOPAMIDOL (ISOVUE-300) INJECTION 61%
100.0000 mL | Freq: Once | INTRAVENOUS | Status: AC | PRN
  Administered 2020-08-23: 100 mL via INTRAVENOUS

## 2020-12-07 ENCOUNTER — Other Ambulatory Visit: Payer: Self-pay | Admitting: Internal Medicine

## 2020-12-07 DIAGNOSIS — Z1231 Encounter for screening mammogram for malignant neoplasm of breast: Secondary | ICD-10-CM

## 2020-12-11 ENCOUNTER — Other Ambulatory Visit: Payer: Self-pay

## 2020-12-11 ENCOUNTER — Ambulatory Visit
Admission: RE | Admit: 2020-12-11 | Discharge: 2020-12-11 | Disposition: A | Discharge status: Home / Self Care | Payer: BC Managed Care – PPO | Source: Ambulatory Visit

## 2020-12-11 DIAGNOSIS — Z1231 Encounter for screening mammogram for malignant neoplasm of breast: Secondary | ICD-10-CM

## 2021-06-06 ENCOUNTER — Other Ambulatory Visit: Payer: Self-pay | Admitting: Internal Medicine

## 2021-06-06 DIAGNOSIS — Z0000 Encounter for general adult medical examination without abnormal findings: Secondary | ICD-10-CM

## 2021-07-25 ENCOUNTER — Other Ambulatory Visit: Payer: BC Managed Care – PPO

## 2021-08-08 ENCOUNTER — Other Ambulatory Visit: Payer: BC Managed Care – PPO

## 2021-09-12 ENCOUNTER — Ambulatory Visit
Admission: RE | Admit: 2021-09-12 | Discharge: 2021-09-12 | Disposition: A | Discharge status: Home / Self Care | Payer: No Typology Code available for payment source | Source: Ambulatory Visit | Attending: Internal Medicine | Admitting: Internal Medicine

## 2021-09-12 DIAGNOSIS — Z0000 Encounter for general adult medical examination without abnormal findings: Secondary | ICD-10-CM

## 2021-11-19 ENCOUNTER — Other Ambulatory Visit: Payer: Self-pay | Admitting: Internal Medicine

## 2021-11-19 DIAGNOSIS — Z1231 Encounter for screening mammogram for malignant neoplasm of breast: Secondary | ICD-10-CM

## 2021-12-13 ENCOUNTER — Ambulatory Visit: Payer: No Typology Code available for payment source

## 2021-12-24 ENCOUNTER — Ambulatory Visit
Admission: RE | Admit: 2021-12-24 | Discharge: 2021-12-24 | Disposition: A | Discharge status: Home / Self Care | Payer: BLUE CROSS/BLUE SHIELD | Source: Ambulatory Visit | Attending: Internal Medicine | Admitting: Internal Medicine

## 2021-12-24 DIAGNOSIS — Z1231 Encounter for screening mammogram for malignant neoplasm of breast: Secondary | ICD-10-CM

## 2022-02-07 ENCOUNTER — Ambulatory Visit: Payer: No Typology Code available for payment source | Admitting: Dietician

## 2022-02-21 ENCOUNTER — Other Ambulatory Visit (INDEPENDENT_AMBULATORY_CARE_PROVIDER_SITE_OTHER): Payer: Self-pay

## 2022-07-31 DIAGNOSIS — H6123 Impacted cerumen, bilateral: Secondary | ICD-10-CM | POA: Diagnosis not present

## 2022-07-31 DIAGNOSIS — H9192 Unspecified hearing loss, left ear: Secondary | ICD-10-CM | POA: Diagnosis not present

## 2022-10-25 DIAGNOSIS — R7989 Other specified abnormal findings of blood chemistry: Secondary | ICD-10-CM | POA: Diagnosis not present

## 2022-10-25 DIAGNOSIS — Z79899 Other long term (current) drug therapy: Secondary | ICD-10-CM | POA: Diagnosis not present

## 2022-11-01 DIAGNOSIS — G4700 Insomnia, unspecified: Secondary | ICD-10-CM | POA: Diagnosis not present

## 2022-11-01 DIAGNOSIS — Z0000 Encounter for general adult medical examination without abnormal findings: Secondary | ICD-10-CM | POA: Diagnosis not present

## 2022-11-01 DIAGNOSIS — F329 Major depressive disorder, single episode, unspecified: Secondary | ICD-10-CM | POA: Diagnosis not present

## 2022-11-01 DIAGNOSIS — R232 Flushing: Secondary | ICD-10-CM | POA: Diagnosis not present

## 2022-11-01 DIAGNOSIS — E789 Disorder of lipoprotein metabolism, unspecified: Secondary | ICD-10-CM | POA: Diagnosis not present

## 2022-11-01 DIAGNOSIS — Z1331 Encounter for screening for depression: Secondary | ICD-10-CM | POA: Diagnosis not present

## 2022-11-11 ENCOUNTER — Other Ambulatory Visit: Payer: Self-pay | Admitting: Internal Medicine

## 2022-11-11 DIAGNOSIS — Z1231 Encounter for screening mammogram for malignant neoplasm of breast: Secondary | ICD-10-CM

## 2022-12-27 ENCOUNTER — Ambulatory Visit: Payer: No Typology Code available for payment source

## 2023-01-22 ENCOUNTER — Ambulatory Visit
Admission: RE | Admit: 2023-01-22 | Discharge: 2023-01-22 | Disposition: A | Discharge status: Home / Self Care | Payer: 59 | Source: Ambulatory Visit | Attending: Internal Medicine | Admitting: Internal Medicine

## 2023-01-22 DIAGNOSIS — Z1231 Encounter for screening mammogram for malignant neoplasm of breast: Secondary | ICD-10-CM

## 2023-08-14 DIAGNOSIS — H40013 Open angle with borderline findings, low risk, bilateral: Secondary | ICD-10-CM | POA: Diagnosis not present

## 2023-09-11 IMAGING — CT CT CARDIAC CORONARY ARTERY CALCIUM SCORE
3 series · 14 of 20 positions shown, 16 images · non-contrast
Comparison: None.

CLINICAL DATA: Family history of heart disease

EXAM:
CT CARDIAC CORONARY ARTERY CALCIUM SCORE
TECHNIQUE: Non-contrast imaging through the heart was performed using
prospective ECG gating. Image post processing was performed on an
independent workstation, allowing for quantitative analysis of the
heart and coronary arteries. Note that this exam targets the heart
and the chest was not imaged in its entirety.

[Series 2: calcium scoring 2.00 qr36 bestdiast 70% hrt calciu · axial · 0.35mm/px · z∈[+1581,+1665]mm · 4 of 71 slices shown]
[im 15/71  vessel]
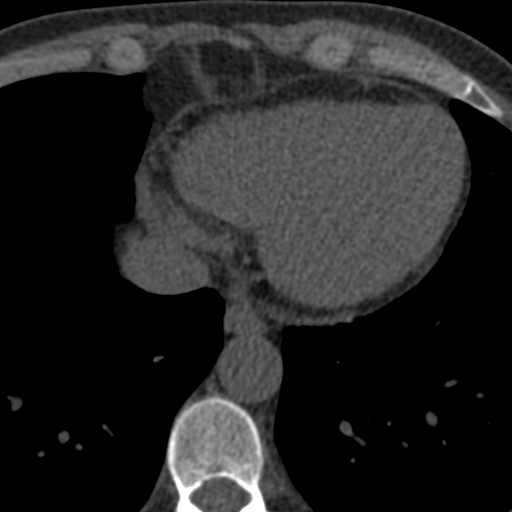
[im 29/71  vessel]
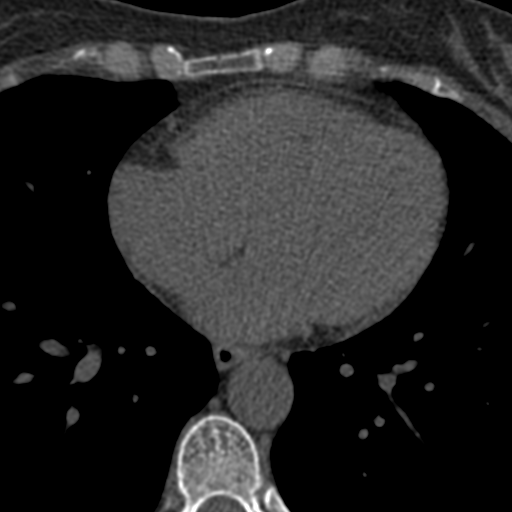
[im 43/71  vessel]
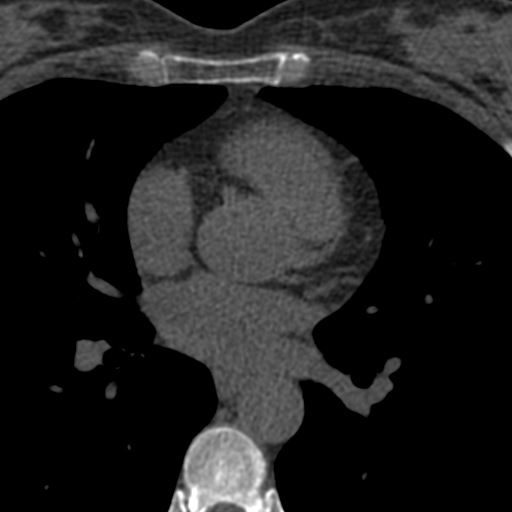
[im 57/71  vessel]
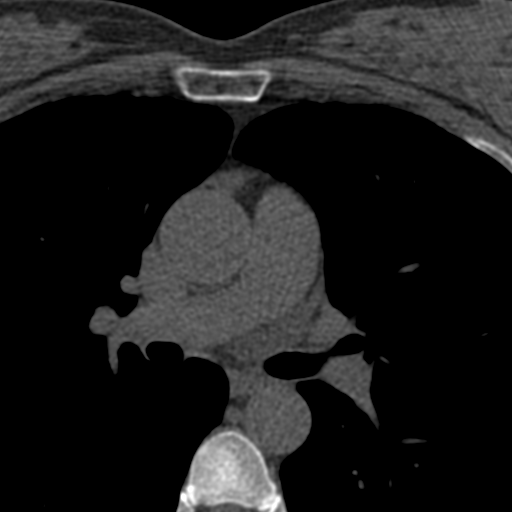

[Series 3: calcium scoring 2.00 br40 bestdiast 70% axial · axial · 0.51mm/px · z∈[+1573,+1669]mm · 5 of 73 slices shown, 7 images]
[im 13/73  vessel]
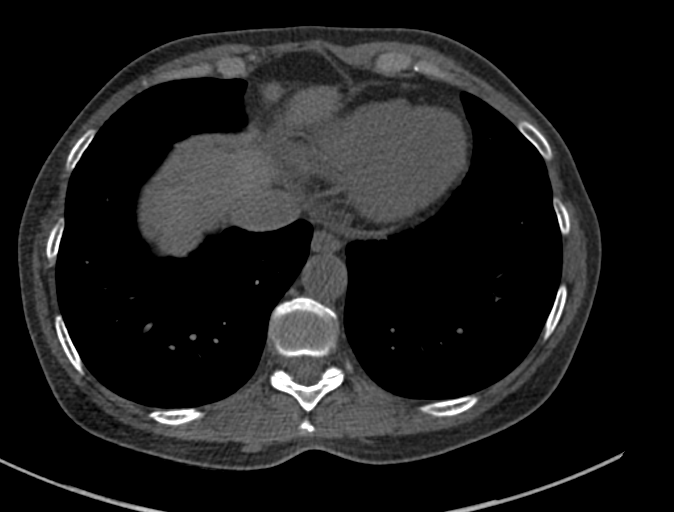
[im 13/73  lung]
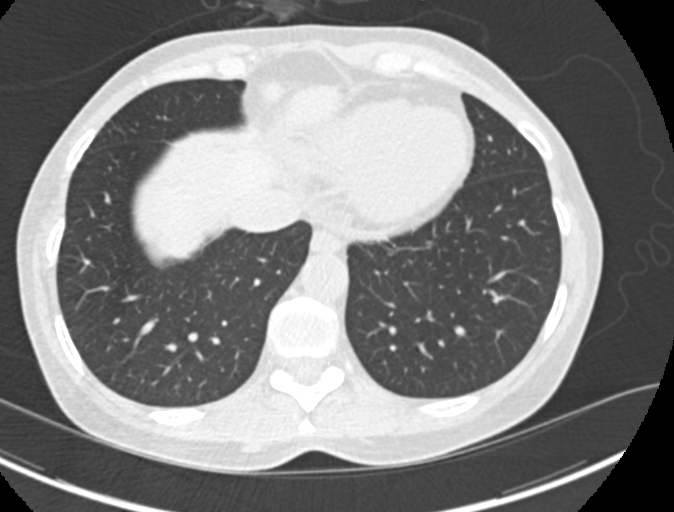
[im 25/73  vessel]
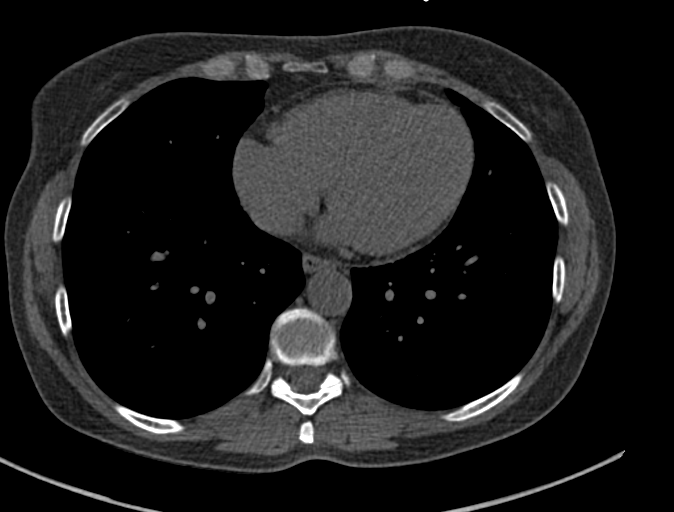
[im 37/73  vessel]
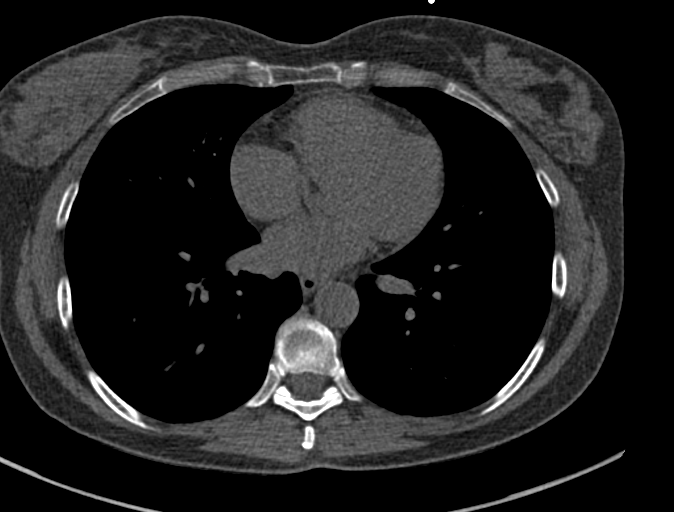
[im 49/73  vessel]
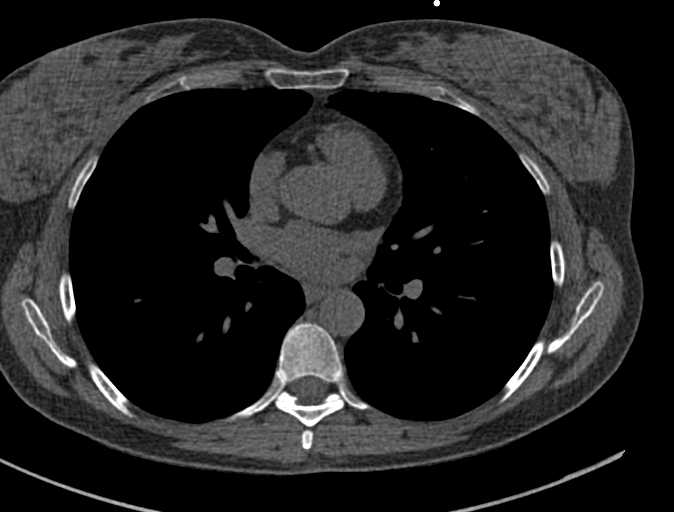
[im 61/73  vessel]
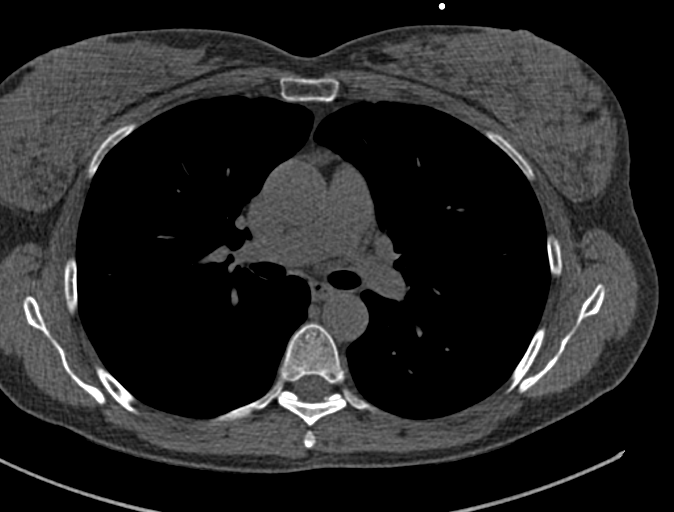
[im 61/73  lung]
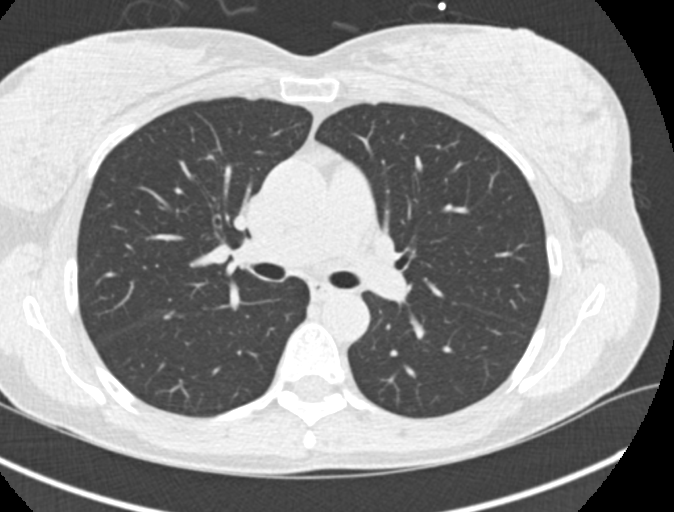

[Series 9: calcium scoring 2.00 br60 bestdiast 70% lungs · axial · 0.51mm/px · z∈[+1573,+1669]mm · 5 of 73 slices shown]
[im 13/73  vessel]
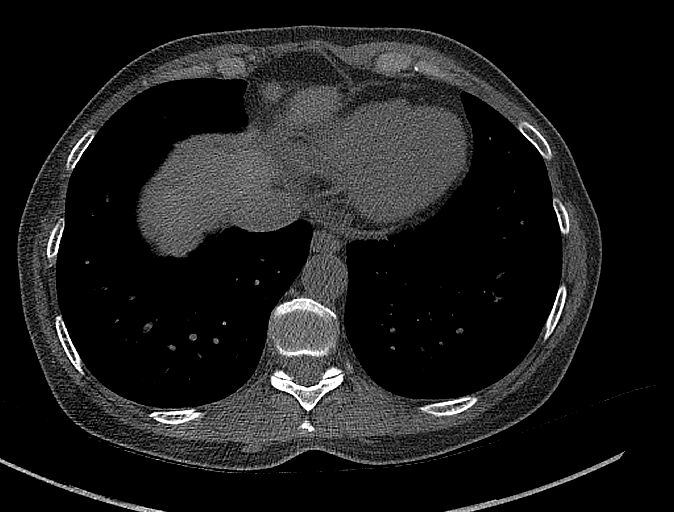
[im 25/73  vessel]
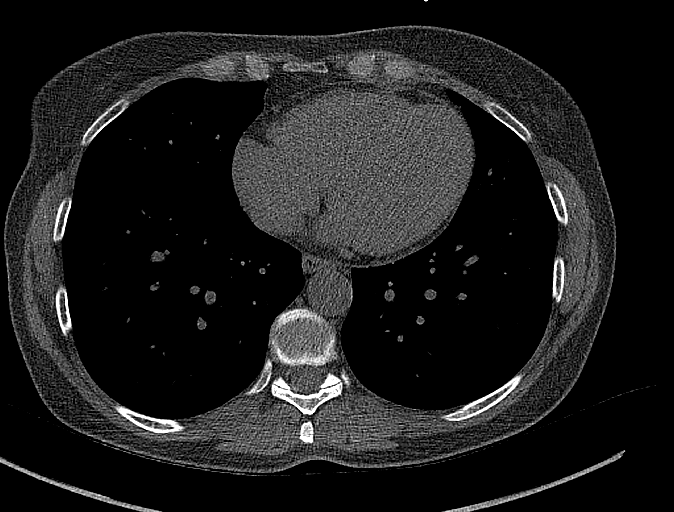
[im 37/73  vessel]
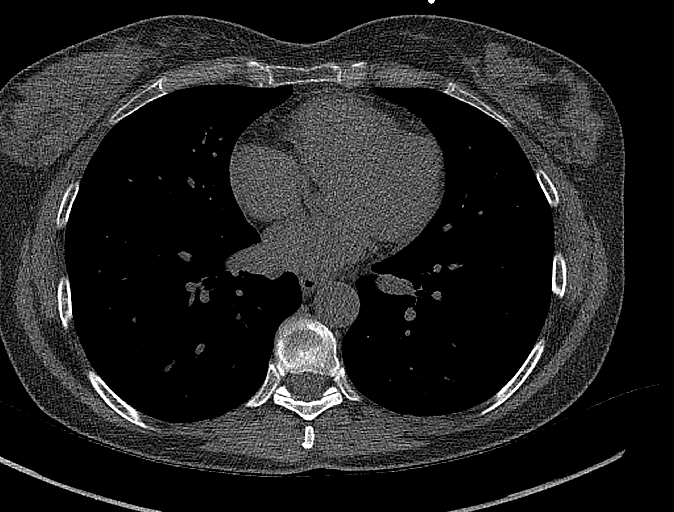
[im 49/73  vessel]
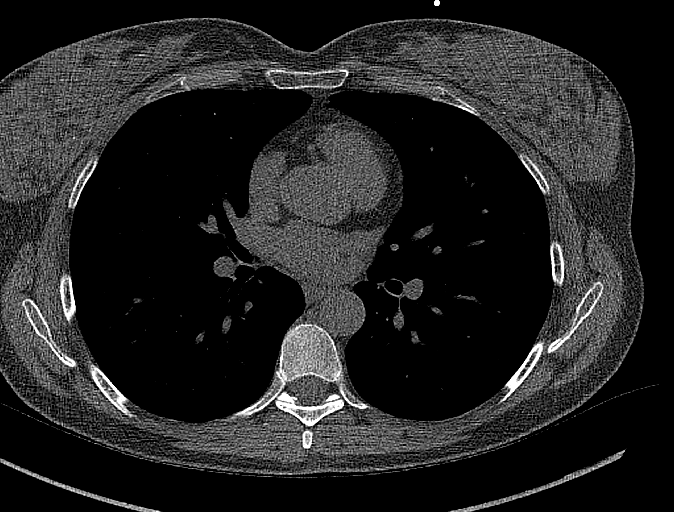
[im 61/73  vessel]
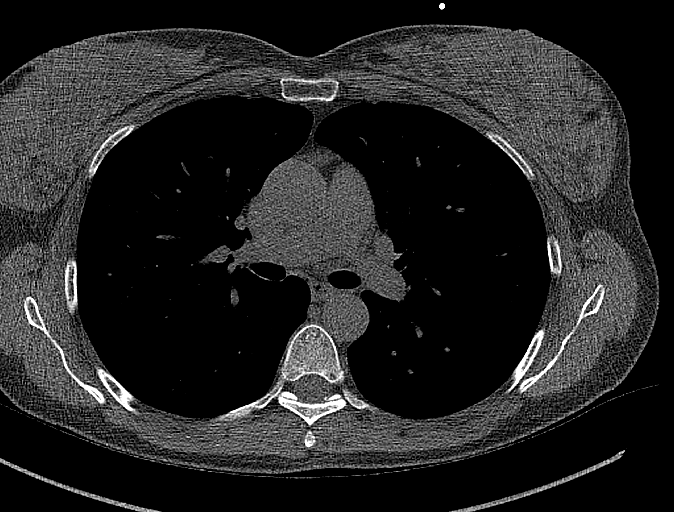

[14 of 20 positions shown; findings below may reference images not displayed]

FINDINGS: CORONARY CALCIUM SCORES:

Total Agatston Score: 0- No coronary calcium identified.

AORTA MEASUREMENTS:

Ascending Aorta: 32 mm

Descending Aorta: 22 mm

OTHER FINDINGS:

Cardiovascular: Aortic atherosclerosis. Normal heart size, without
pericardial effusion.

Mediastinum/Nodes: No imaged thoracic adenopathy.

Lungs/Pleura: No pleural fluid.  Clear imaged lungs.

Upper Abdomen: Normal imaged portions of the liver, spleen, stomach.

Musculoskeletal: No acute osseous abnormality.
IMPRESSION: 1. No coronary calcium identified.
2. Aortic Atherosclerosis ([id number]).

## 2023-10-27 DIAGNOSIS — E789 Disorder of lipoprotein metabolism, unspecified: Secondary | ICD-10-CM | POA: Diagnosis not present

## 2023-10-27 DIAGNOSIS — Z1212 Encounter for screening for malignant neoplasm of rectum: Secondary | ICD-10-CM | POA: Diagnosis not present

## 2023-11-03 DIAGNOSIS — Z1331 Encounter for screening for depression: Secondary | ICD-10-CM | POA: Diagnosis not present

## 2023-11-03 DIAGNOSIS — G4700 Insomnia, unspecified: Secondary | ICD-10-CM | POA: Diagnosis not present

## 2023-11-03 DIAGNOSIS — Z0000 Encounter for general adult medical examination without abnormal findings: Secondary | ICD-10-CM | POA: Diagnosis not present

## 2023-11-03 DIAGNOSIS — R82998 Other abnormal findings in urine: Secondary | ICD-10-CM | POA: Diagnosis not present

## 2024-01-27 ENCOUNTER — Other Ambulatory Visit: Payer: Self-pay | Admitting: Internal Medicine

## 2024-01-27 DIAGNOSIS — Z1231 Encounter for screening mammogram for malignant neoplasm of breast: Secondary | ICD-10-CM

## 2024-02-04 ENCOUNTER — Ambulatory Visit
Admission: RE | Admit: 2024-02-04 | Discharge: 2024-02-04 | Disposition: A | Discharge status: Home / Self Care | Source: Ambulatory Visit | Attending: Internal Medicine | Admitting: Internal Medicine

## 2024-02-04 DIAGNOSIS — Z1231 Encounter for screening mammogram for malignant neoplasm of breast: Secondary | ICD-10-CM

## 2024-04-15 ENCOUNTER — Other Ambulatory Visit: Payer: Self-pay | Admitting: *Deleted

## 2024-04-15 DIAGNOSIS — R002 Palpitations: Secondary | ICD-10-CM

## 2024-04-15 DIAGNOSIS — I493 Ventricular premature depolarization: Secondary | ICD-10-CM

## 2024-05-05 ENCOUNTER — Ambulatory Visit: Attending: Registered Nurse

## 2024-05-05 DIAGNOSIS — R002 Palpitations: Secondary | ICD-10-CM

## 2024-05-05 DIAGNOSIS — I493 Ventricular premature depolarization: Secondary | ICD-10-CM

## 2024-05-12 DIAGNOSIS — R002 Palpitations: Secondary | ICD-10-CM

## 2024-05-12 DIAGNOSIS — I493 Ventricular premature depolarization: Secondary | ICD-10-CM
